# Patient Record
Sex: Female | Born: 1980 | Race: White | Hispanic: No | Marital: Married | Smoking: Never smoker
Health system: Southern US, Community
[De-identification: ages and names within clinical notes are randomized; demographics above are authoritative.]

## PROBLEM LIST (undated history)

## (undated) DIAGNOSIS — T8859XA Other complications of anesthesia, initial encounter: Secondary | ICD-10-CM

## (undated) DIAGNOSIS — T4145XA Adverse effect of unspecified anesthetic, initial encounter: Secondary | ICD-10-CM

## (undated) DIAGNOSIS — O24419 Gestational diabetes mellitus in pregnancy, unspecified control: Secondary | ICD-10-CM

## (undated) HISTORY — PX: WISDOM TOOTH EXTRACTION: SHX21

## (undated) HISTORY — DX: Gestational diabetes mellitus in pregnancy, unspecified control: O24.419

## (undated) HISTORY — PX: UTERINE FIBROID SURGERY: SHX826

---

## 2007-02-03 ENCOUNTER — Ambulatory Visit: Payer: Self-pay

## 2007-02-07 ENCOUNTER — Ambulatory Visit: Payer: Self-pay

## 2007-02-12 ENCOUNTER — Ambulatory Visit: Payer: Self-pay

## 2007-08-13 ENCOUNTER — Ambulatory Visit: Payer: Self-pay | Admitting: Unknown Physician Specialty

## 2007-08-14 ENCOUNTER — Ambulatory Visit: Payer: Self-pay | Admitting: Unknown Physician Specialty

## 2008-05-31 ENCOUNTER — Observation Stay: Payer: Self-pay

## 2008-07-08 ENCOUNTER — Observation Stay: Payer: Self-pay | Admitting: Obstetrics and Gynecology

## 2008-07-10 ENCOUNTER — Observation Stay: Payer: Self-pay

## 2008-07-17 ENCOUNTER — Inpatient Hospital Stay: Payer: Self-pay | Admitting: Obstetrics & Gynecology

## 2012-05-28 ENCOUNTER — Ambulatory Visit: Payer: Self-pay | Admitting: Obstetrics and Gynecology

## 2012-05-28 LAB — BASIC METABOLIC PANEL
BUN: 7 mg/dL (ref 7–18)
Calcium, Total: 8.5 mg/dL (ref 8.5–10.1)
Chloride: 111 mmol/L — ABNORMAL HIGH (ref 98–107)
Co2: 27 mmol/L (ref 21–32)
EGFR (Non-African Amer.): >60
Osmolality: 282 (ref 275–301)
Potassium: 4 mmol/L (ref 3.5–5.1)

## 2012-05-28 LAB — CBC
HGB: 12.2 g/dL (ref 12.0–16.0)
MCH: 29.9 pg (ref 26.0–34.0)
MCV: 89 fL (ref 80–100)
RBC: 4.07 10*6/uL (ref 3.80–5.20)
WBC: 6.8 10*3/uL (ref 3.6–11.0)

## 2012-05-28 LAB — PREGNANCY, URINE: Pregnancy Test, Urine: NEGATIVE m[IU]/mL

## 2012-06-08 ENCOUNTER — Inpatient Hospital Stay: Payer: Self-pay | Admitting: Obstetrics and Gynecology

## 2012-06-11 LAB — PATHOLOGY REPORT

## 2013-09-02 ENCOUNTER — Other Ambulatory Visit (HOSPITAL_COMMUNITY): Payer: Self-pay | Admitting: Obstetrics and Gynecology

## 2013-09-02 DIAGNOSIS — IMO0002 Reserved for concepts with insufficient information to code with codable children: Secondary | ICD-10-CM

## 2013-09-09 ENCOUNTER — Ambulatory Visit (HOSPITAL_COMMUNITY)
Admission: RE | Admit: 2013-09-09 | Discharge: 2013-09-09 | Disposition: A | Discharge status: Home / Self Care | Payer: BC Managed Care – PPO | Source: Ambulatory Visit | Attending: Obstetrics and Gynecology | Admitting: Obstetrics and Gynecology

## 2013-09-09 DIAGNOSIS — IMO0002 Reserved for concepts with insufficient information to code with codable children: Secondary | ICD-10-CM

## 2013-09-09 DIAGNOSIS — N979 Female infertility, unspecified: Secondary | ICD-10-CM | POA: Insufficient documentation

## 2013-09-09 MED ORDER — IOHEXOL 300 MG/ML  SOLN
20.0000 mL | Freq: Once | INTRAMUSCULAR | Status: AC | PRN
  Administered 2013-09-09: 20 mL

## 2013-12-25 LAB — OB RESULTS CONSOLE HIV ANTIBODY (ROUTINE TESTING): HIV: NONREACTIVE

## 2013-12-25 LAB — OB RESULTS CONSOLE ANTIBODY SCREEN: Antibody Screen: NEGATIVE

## 2013-12-25 LAB — OB RESULTS CONSOLE ABO/RH: RH Type: NEGATIVE

## 2013-12-25 LAB — OB RESULTS CONSOLE RPR: RPR: NONREACTIVE

## 2013-12-25 LAB — OB RESULTS CONSOLE RUBELLA ANTIBODY, IGM: Rubella: IMMUNE

## 2013-12-25 LAB — OB RESULTS CONSOLE HEPATITIS B SURFACE ANTIGEN: HEP B S AG: NEGATIVE

## 2014-05-14 ENCOUNTER — Encounter: Payer: BC Managed Care – PPO | Attending: Obstetrics and Gynecology

## 2014-05-14 VITALS — Ht 64.5 in | Wt 158.7 lb

## 2014-05-14 DIAGNOSIS — Z713 Dietary counseling and surveillance: Secondary | ICD-10-CM | POA: Insufficient documentation

## 2014-05-14 DIAGNOSIS — O24410 Gestational diabetes mellitus in pregnancy, diet controlled: Secondary | ICD-10-CM | POA: Diagnosis present

## 2014-05-15 NOTE — Progress Notes (Signed)
  Patient was seen on 05/15/14 for Gestational Diabetes self-management . The following learning objectives were met by the patient :   States the definition of Gestational Diabetes  States why dietary management is important in controlling blood glucose  Describes the effects of carbohydrates on blood glucose levels  Demonstrates ability to create a balanced meal plan  Demonstrates carbohydrate counting   States when to check blood glucose levels  Demonstrates proper blood glucose monitoring techniques  States the effect of stress and exercise on blood glucose levels  States the importance of limiting caffeine and abstaining from alcohol and smoking  Plan:  Aim for 2 Carb Choices per meal (30 grams) +/- 1 either way for breakfast Aim for 3 Carb Choices per meal (45 grams) +/- 1 either way from lunch and dinner Aim for 1-2 Carbs per snack Begin reading food labels for Total Carbohydrate and sugar grams of foods Consider  increasing your activity level by walking daily as tolerated Begin checking BG before breakfast and 2 hours after first bit of breakfast, lunch and dinner after  as directed by MD  Take medication  as directed by MD  Blood glucose monitor given: one Touch Ultra 2 Lot # [id number] Y Exp: 11/2014 Blood glucose reading: $RemoveBeforeDE'89mg'hrVeyGBVqVWNNet$ /dl  Patient instructed to monitor glucose levels: FBS: 60 - <90 2 hour: <120  Patient received the following handouts:  Nutrition Diabetes and Pregnancy  Carbohydrate Counting List  Meal Planning worksheet  Patient will be seen for follow-up as needed.

## 2014-06-09 ENCOUNTER — Other Ambulatory Visit: Payer: Self-pay | Admitting: Obstetrics and Gynecology

## 2014-07-07 ENCOUNTER — Encounter (HOSPITAL_COMMUNITY): Payer: Self-pay

## 2014-07-08 ENCOUNTER — Encounter (HOSPITAL_COMMUNITY): Payer: Self-pay

## 2014-07-08 ENCOUNTER — Encounter (HOSPITAL_COMMUNITY)
Admission: RE | Admit: 2014-07-08 | Discharge: 2014-07-08 | Disposition: A | Discharge status: Home / Self Care | Payer: BC Managed Care – PPO | Source: Ambulatory Visit | Attending: Obstetrics and Gynecology | Admitting: Obstetrics and Gynecology

## 2014-07-08 HISTORY — DX: Adverse effect of unspecified anesthetic, initial encounter: T41.45XA

## 2014-07-08 HISTORY — DX: Other complications of anesthesia, initial encounter: T88.59XA

## 2014-07-08 LAB — CBC
HCT: 38.1 % (ref 36.0–46.0)
Hemoglobin: 12.8 g/dL (ref 12.0–15.0)
MCH: 31 pg (ref 26.0–34.0)
MCHC: 33.6 g/dL (ref 30.0–36.0)
MCV: 92.3 fL (ref 78.0–100.0)
PLATELETS: 182 10*3/uL (ref 150–400)
RBC: 4.13 MIL/uL (ref 3.87–5.11)
RDW: 13.5 % (ref 11.5–15.5)
WBC: 10.8 10*3/uL — ABNORMAL HIGH (ref 4.0–10.5)

## 2014-07-08 LAB — BASIC METABOLIC PANEL
ANION GAP: 13 (ref 5–15)
BUN: 11 mg/dL (ref 6–23)
CHLORIDE: 102 meq/L (ref 96–112)
CO2: 23 meq/L (ref 19–32)
Calcium: 9 mg/dL (ref 8.4–10.5)
Creatinine, Ser: 0.49 mg/dL — ABNORMAL LOW (ref 0.50–1.10)
GFR calc Af Amer: >90 mL/min (ref >90)
GFR calc non Af Amer: >90 mL/min (ref >90)
Glucose, Bld: 91 mg/dL (ref 70–99)
Potassium: 4.1 mEq/L (ref 3.7–5.3)
SODIUM: 138 meq/L (ref 137–147)

## 2014-07-08 LAB — RPR

## 2014-07-08 NOTE — Patient Instructions (Addendum)
Your procedure is scheduled on:07/10/14  Enter through the Main Entrance at :0945 am Pick up desk phone and dial [phone] and inform us of your arrival.  Please call [phone] if you have any problems the morning of surgery.  Remember: Do not eat food or drink liquids, including water, after midnight:WED Clear liquids are ok until:7am on 07/10/14   You may brush your teeth the morning of surgery.   DO NOT wear jewelry, eye make-up, lipstick,body lotion, or dark fingernail polish.  (Polished toes are ok) You may wear deodorant.  If you are to be admitted after surgery, leave suitcase in car until your room has been assigned. Patients discharged on the day of surgery will not be allowed to drive home. Wear loose fitting, comfortable clothes for your ride home.

## 2014-07-09 LAB — TYPE AND SCREEN
ABO/RH(D): O NEG
Antibody Screen: POSITIVE
DAT, IGG: NEGATIVE
UNIT DIVISION: 0
Unit division: 0

## 2014-07-09 NOTE — H&P (Signed)
NAMCatalina [name]:  [name], [name]              ACCOUNT NO.:  [id number]  MEDICAL RECORD NO.:  [id number]  LOCATION:  PERIO                         FACILITY:  [facility]  PHYSICIAN:  Lenoard Adenichard J. Mersadie Kavanaugh, M.D.DATE OF BIRTH:  1981-01-19  DATE OF ADMISSION:  05/08/2014 DATE OF DISCHARGE:                             HISTORY & PHYSICAL   CHIEF COMPLAINT:  History of myomectomy with cavity entry for primary C- section at 37 weeks.  HISTORY OF PRESENT ILLNESS:  She is a 33 year old white female G3, P1, who presents for primary C-section at 37 weekS.  She has previous history of vaginal delivery. Previous Myomectomy.  Nonsmoker and nondrinker. Current pregnancy, complicated by gestational diabetes.  FAMILY HISTORY:  Crohn disease, heart disease, diabetes, hypertension, and lung cancer.  ALLERGIES:  Septra, Macrobid  PHYSICAL EXAMINATION:  GENERAL:  She is a well-developed, well- nourished, white female, in no acute distress. HEENT:  Normal. NECK:  Supple.  Full range of motion. LUNGS:  Clear. HEART:  Regular rate and rhythm. ABDOMEN:  Soft, gravid, nontender. EXt: no cords Neuro: non focal  IMPRESSION:  37 week IUP History of myomectomy with intracavitary entry. Gestational diabetes, diet controlled.  PLAN:  Proceed with primary low segment transverse cesarean section Risks of surgery to include delayed versus immediate complications including bowel and bladder injury with possible need for repair noted. Pt acknowledges  And desires to proceed. Consent done.     Lenoard Adenichard J. [name] [name], M.D.     RJT/MEDQ  D:  07/09/2014  T:  07/09/2014  Job:  [id number]

## 2014-07-10 ENCOUNTER — Inpatient Hospital Stay (HOSPITAL_COMMUNITY): Payer: BC Managed Care – PPO | Admitting: Anesthesiology

## 2014-07-10 ENCOUNTER — Inpatient Hospital Stay (HOSPITAL_COMMUNITY)
Admission: RE | Admit: 2014-07-10 | Discharge: 2014-07-12 | DRG: 766 | Disposition: A | Discharge status: Home / Self Care | Payer: BC Managed Care – PPO | Source: Ambulatory Visit | Attending: Obstetrics and Gynecology | Admitting: Obstetrics and Gynecology

## 2014-07-10 ENCOUNTER — Encounter (HOSPITAL_COMMUNITY): Payer: Self-pay | Admitting: *Deleted

## 2014-07-10 ENCOUNTER — Encounter (HOSPITAL_COMMUNITY)
Admission: RE | Disposition: A | Discharge status: Home / Self Care | Payer: Self-pay | Source: Ambulatory Visit | Attending: Obstetrics and Gynecology

## 2014-07-10 DIAGNOSIS — Z8249 Family history of ischemic heart disease and other diseases of the circulatory system: Secondary | ICD-10-CM

## 2014-07-10 DIAGNOSIS — O360930 Maternal care for other rhesus isoimmunization, third trimester, not applicable or unspecified: Secondary | ICD-10-CM | POA: Diagnosis present

## 2014-07-10 DIAGNOSIS — O24420 Gestational diabetes mellitus in childbirth, diet controlled: Secondary | ICD-10-CM | POA: Diagnosis present

## 2014-07-10 DIAGNOSIS — Z6791 Unspecified blood type, Rh negative: Secondary | ICD-10-CM

## 2014-07-10 DIAGNOSIS — O3429 Maternal care due to uterine scar from other previous surgery: Secondary | ICD-10-CM | POA: Diagnosis present

## 2014-07-10 DIAGNOSIS — O09523 Supervision of elderly multigravida, third trimester: Secondary | ICD-10-CM

## 2014-07-10 DIAGNOSIS — Z833 Family history of diabetes mellitus: Secondary | ICD-10-CM

## 2014-07-10 DIAGNOSIS — Z3A37 37 weeks gestation of pregnancy: Secondary | ICD-10-CM | POA: Diagnosis present

## 2014-07-10 DIAGNOSIS — O26899 Other specified pregnancy related conditions, unspecified trimester: Secondary | ICD-10-CM | POA: Diagnosis not present

## 2014-07-10 LAB — GLUCOSE, CAPILLARY: GLUCOSE-CAPILLARY: 83 mg/dL (ref 70–99)

## 2014-07-10 SURGERY — Surgical Case
Anesthesia: Spinal | Site: Abdomen

## 2014-07-10 MED ORDER — PHENYLEPHRINE 8 MG IN D5W 100 ML (0.08MG/ML) PREMIX OPTIME
INJECTION | INTRAVENOUS | Status: DC | PRN
  Administered 2014-07-10: 60 ug/min via INTRAVENOUS

## 2014-07-10 MED ORDER — SCOPOLAMINE 1 MG/3DAYS TD PT72
1.0000 | MEDICATED_PATCH | Freq: Once | TRANSDERMAL | Status: DC
  Administered 2014-07-10: 1.5 mg via TRANSDERMAL

## 2014-07-10 MED ORDER — ONDANSETRON HCL 4 MG/2ML IJ SOLN: 4.0000 mg | Freq: Three times a day (TID) | INTRAMUSCULAR | Status: DC | PRN

## 2014-07-10 MED ORDER — DIPHENHYDRAMINE HCL 50 MG/ML IJ SOLN: 12.5000 mg | INTRAMUSCULAR | Status: DC | PRN

## 2014-07-10 MED ORDER — BUPIVACAINE IN DEXTROSE 0.75-8.25 % IT SOLN
INTRATHECAL | Status: DC | PRN
  Administered 2014-07-10: 1.4 mL via INTRATHECAL

## 2014-07-10 MED ORDER — KETOROLAC TROMETHAMINE 30 MG/ML IJ SOLN
30.0000 mg | Freq: Four times a day (QID) | INTRAMUSCULAR | Status: AC | PRN
  Administered 2014-07-10: 30 mg via INTRAMUSCULAR

## 2014-07-10 MED ORDER — METHYLERGONOVINE MALEATE 0.2 MG/ML IJ SOLN: 0.2000 mg | INTRAMUSCULAR | Status: DC | PRN

## 2014-07-10 MED ORDER — SODIUM CHLORIDE 0.9 % IJ SOLN
INTRAMUSCULAR | Status: AC
  Filled 2014-07-10: qty 20

## 2014-07-10 MED ORDER — LACTATED RINGERS IV SOLN
Freq: Once | INTRAVENOUS | Status: AC
  Administered 2014-07-10: 10:00:00 via INTRAVENOUS

## 2014-07-10 MED ORDER — MENTHOL 3 MG MT LOZG: 1.0000 | LOZENGE | OROMUCOSAL | Status: DC | PRN

## 2014-07-10 MED ORDER — MEPERIDINE HCL 25 MG/ML IJ SOLN: 6.2500 mg | INTRAMUSCULAR | Status: DC | PRN

## 2014-07-10 MED ORDER — PRENATAL MULTIVITAMIN CH
1.0000 | ORAL_TABLET | Freq: Every day | ORAL | Status: DC
  Administered 2014-07-11 – 2014-07-12 (×2): 1 via ORAL
  Filled 2014-07-10 (×2): qty 1

## 2014-07-10 MED ORDER — OXYTOCIN 40 UNITS IN LACTATED RINGERS INFUSION - SIMPLE MED
INTRAVENOUS | Status: DC | PRN
  Administered 2014-07-10: 40 [IU] via INTRAVENOUS

## 2014-07-10 MED ORDER — DIPHENHYDRAMINE HCL 25 MG PO CAPS: 25.0000 mg | ORAL_CAPSULE | ORAL | Status: DC | PRN

## 2014-07-10 MED ORDER — DIPHENHYDRAMINE HCL 25 MG PO CAPS
25.0000 mg | ORAL_CAPSULE | Freq: Four times a day (QID) | ORAL | Status: DC | PRN
  Administered 2014-07-10: 25 mg via ORAL
  Filled 2014-07-10: qty 1

## 2014-07-10 MED ORDER — ONDANSETRON HCL 4 MG/2ML IJ SOLN
INTRAMUSCULAR | Status: AC
  Filled 2014-07-10: qty 2

## 2014-07-10 MED ORDER — FENTANYL CITRATE 0.05 MG/ML IJ SOLN
INTRAMUSCULAR | Status: DC | PRN
  Administered 2014-07-10: 15 ug via INTRATHECAL

## 2014-07-10 MED ORDER — FENTANYL CITRATE 0.05 MG/ML IJ SOLN
25.0000 ug | INTRAMUSCULAR | Status: DC | PRN
  Administered 2014-07-10: 25 ug via INTRAVENOUS
  Administered 2014-07-10: 50 ug via INTRAVENOUS

## 2014-07-10 MED ORDER — MORPHINE SULFATE (PF) 0.5 MG/ML IJ SOLN
INTRAMUSCULAR | Status: DC | PRN
  Administered 2014-07-10: .1 mg via INTRATHECAL

## 2014-07-10 MED ORDER — FENTANYL CITRATE 0.05 MG/ML IJ SOLN
INTRAMUSCULAR | Status: AC
  Administered 2014-07-10: 25 ug via INTRAVENOUS
  Filled 2014-07-10: qty 2

## 2014-07-10 MED ORDER — SCOPOLAMINE 1 MG/3DAYS TD PT72
MEDICATED_PATCH | TRANSDERMAL | Status: AC
  Administered 2014-07-10: 1.5 mg via TRANSDERMAL
  Filled 2014-07-10: qty 1

## 2014-07-10 MED ORDER — ONDANSETRON HCL 4 MG/2ML IJ SOLN: 4.0000 mg | INTRAMUSCULAR | Status: DC | PRN

## 2014-07-10 MED ORDER — PHENYLEPHRINE 8 MG IN D5W 100 ML (0.08MG/ML) PREMIX OPTIME
INJECTION | INTRAVENOUS | Status: AC
  Filled 2014-07-10: qty 100

## 2014-07-10 MED ORDER — ONDANSETRON HCL 4 MG/2ML IJ SOLN
INTRAMUSCULAR | Status: DC | PRN
  Administered 2014-07-10: 4 mg via INTRAVENOUS

## 2014-07-10 MED ORDER — OXYCODONE-ACETAMINOPHEN 5-325 MG PO TABS
2.0000 | ORAL_TABLET | ORAL | Status: DC | PRN
  Administered 2014-07-11: 2 via ORAL
  Filled 2014-07-10 (×2): qty 2

## 2014-07-10 MED ORDER — NALOXONE HCL 1 MG/ML IJ SOLN
1.0000 ug/kg/h | INTRAMUSCULAR | Status: DC | PRN
  Filled 2014-07-10: qty 2

## 2014-07-10 MED ORDER — NALBUPHINE HCL 10 MG/ML IJ SOLN: 5.0000 mg | Freq: Once | INTRAMUSCULAR | Status: AC | PRN

## 2014-07-10 MED ORDER — BUPIVACAINE HCL (PF) 0.25 % IJ SOLN
INTRAMUSCULAR | Status: DC | PRN
  Administered 2014-07-10: 10 mL

## 2014-07-10 MED ORDER — OXYTOCIN 10 UNIT/ML IJ SOLN
INTRAMUSCULAR | Status: AC
  Filled 2014-07-10: qty 4

## 2014-07-10 MED ORDER — MORPHINE SULFATE 0.5 MG/ML IJ SOLN
INTRAMUSCULAR | Status: AC
  Filled 2014-07-10: qty 10

## 2014-07-10 MED ORDER — SIMETHICONE 80 MG PO CHEW
80.0000 mg | CHEWABLE_TABLET | ORAL | Status: DC
  Administered 2014-07-11 – 2014-07-12 (×2): 80 mg via ORAL
  Filled 2014-07-10 (×2): qty 1

## 2014-07-10 MED ORDER — BUPIVACAINE HCL (PF) 0.25 % IJ SOLN
INTRAMUSCULAR | Status: AC
  Filled 2014-07-10: qty 30

## 2014-07-10 MED ORDER — SCOPOLAMINE 1 MG/3DAYS TD PT72
1.0000 | MEDICATED_PATCH | Freq: Once | TRANSDERMAL | Status: DC
  Filled 2014-07-10: qty 1

## 2014-07-10 MED ORDER — LACTATED RINGERS IV SOLN
INTRAVENOUS | Status: DC | PRN
  Administered 2014-07-10: 12:00:00 via INTRAVENOUS

## 2014-07-10 MED ORDER — PROMETHAZINE HCL 25 MG/ML IJ SOLN: 6.2500 mg | INTRAMUSCULAR | Status: DC | PRN

## 2014-07-10 MED ORDER — SENNOSIDES-DOCUSATE SODIUM 8.6-50 MG PO TABS
2.0000 | ORAL_TABLET | ORAL | Status: DC
  Administered 2014-07-11 (×2): 2 via ORAL
  Filled 2014-07-10 (×2): qty 2

## 2014-07-10 MED ORDER — NALBUPHINE HCL 10 MG/ML IJ SOLN: 5.0000 mg | INTRAMUSCULAR | Status: DC | PRN

## 2014-07-10 MED ORDER — LACTATED RINGERS IV SOLN
INTRAVENOUS | Status: DC
  Administered 2014-07-10: 22:00:00 via INTRAVENOUS

## 2014-07-10 MED ORDER — SODIUM CHLORIDE 0.9 % IJ SOLN
3.0000 mL | INTRAMUSCULAR | Status: DC | PRN
  Administered 2014-07-11: 3 mL via INTRAVENOUS
  Filled 2014-07-10: qty 3

## 2014-07-10 MED ORDER — KETOROLAC TROMETHAMINE 30 MG/ML IJ SOLN: 30.0000 mg | Freq: Four times a day (QID) | INTRAMUSCULAR | Status: AC | PRN

## 2014-07-10 MED ORDER — ONDANSETRON HCL 4 MG PO TABS: 4.0000 mg | ORAL_TABLET | ORAL | Status: DC | PRN

## 2014-07-10 MED ORDER — KETOROLAC TROMETHAMINE 30 MG/ML IJ SOLN
INTRAMUSCULAR | Status: AC
  Administered 2014-07-10: 30 mg via INTRAMUSCULAR
  Filled 2014-07-10: qty 1

## 2014-07-10 MED ORDER — WITCH HAZEL-GLYCERIN EX PADS: 1.0000 "application " | MEDICATED_PAD | CUTANEOUS | Status: DC | PRN

## 2014-07-10 MED ORDER — KETOROLAC TROMETHAMINE 30 MG/ML IJ SOLN: 15.0000 mg | Freq: Once | INTRAMUSCULAR | Status: DC | PRN

## 2014-07-10 MED ORDER — SIMETHICONE 80 MG PO CHEW: 80.0000 mg | CHEWABLE_TABLET | ORAL | Status: DC | PRN

## 2014-07-10 MED ORDER — LANOLIN HYDROUS EX OINT: 1.0000 "application " | TOPICAL_OINTMENT | CUTANEOUS | Status: DC | PRN

## 2014-07-10 MED ORDER — DIBUCAINE 1 % RE OINT: 1.0000 "application " | TOPICAL_OINTMENT | RECTAL | Status: DC | PRN

## 2014-07-10 MED ORDER — NALBUPHINE HCL 10 MG/ML IJ SOLN
5.0000 mg | INTRAMUSCULAR | Status: DC | PRN
  Administered 2014-07-11 (×3): 5 mg via INTRAVENOUS
  Filled 2014-07-10 (×3): qty 1

## 2014-07-10 MED ORDER — LACTATED RINGERS IV SOLN
INTRAVENOUS | Status: DC
  Administered 2014-07-10: 10:00:00 via INTRAVENOUS

## 2014-07-10 MED ORDER — OXYCODONE-ACETAMINOPHEN 5-325 MG PO TABS
1.0000 | ORAL_TABLET | ORAL | Status: DC | PRN
  Administered 2014-07-11 – 2014-07-12 (×5): 1 via ORAL
  Filled 2014-07-10 (×4): qty 1

## 2014-07-10 MED ORDER — NALOXONE HCL 0.4 MG/ML IJ SOLN: 0.4000 mg | INTRAMUSCULAR | Status: DC | PRN

## 2014-07-10 MED ORDER — OXYTOCIN 40 UNITS IN LACTATED RINGERS INFUSION - SIMPLE MED: 62.5000 mL/h | INTRAVENOUS | Status: AC

## 2014-07-10 MED ORDER — PHENYLEPHRINE HCL 10 MG/ML IJ SOLN
INTRAMUSCULAR | Status: DC | PRN
  Administered 2014-07-10: 40 ug via INTRAVENOUS
  Administered 2014-07-10: 80 ug via INTRAVENOUS
  Administered 2014-07-10: 40 ug via INTRAVENOUS

## 2014-07-10 MED ORDER — IBUPROFEN 600 MG PO TABS
600.0000 mg | ORAL_TABLET | Freq: Four times a day (QID) | ORAL | Status: DC
  Administered 2014-07-10 – 2014-07-12 (×8): 600 mg via ORAL
  Filled 2014-07-10 (×8): qty 1

## 2014-07-10 MED ORDER — CEFAZOLIN SODIUM-DEXTROSE 2-3 GM-% IV SOLR
2.0000 g | INTRAVENOUS | Status: AC
  Administered 2014-07-10: 2 g via INTRAVENOUS

## 2014-07-10 MED ORDER — SIMETHICONE 80 MG PO CHEW
80.0000 mg | CHEWABLE_TABLET | Freq: Three times a day (TID) | ORAL | Status: DC
  Administered 2014-07-10 – 2014-07-12 (×5): 80 mg via ORAL
  Filled 2014-07-10 (×6): qty 1

## 2014-07-10 MED ORDER — CEFAZOLIN SODIUM-DEXTROSE 2-3 GM-% IV SOLR
INTRAVENOUS | Status: AC
  Filled 2014-07-10: qty 50

## 2014-07-10 MED ORDER — FENTANYL CITRATE 0.05 MG/ML IJ SOLN
INTRAMUSCULAR | Status: AC
  Filled 2014-07-10: qty 2

## 2014-07-10 MED ORDER — ZOLPIDEM TARTRATE 5 MG PO TABS: 5.0000 mg | ORAL_TABLET | Freq: Every evening | ORAL | Status: DC | PRN

## 2014-07-10 MED ORDER — OXYTOCIN 40 UNITS IN LACTATED RINGERS INFUSION - SIMPLE MED
INTRAVENOUS | Status: DC | PRN
Start: 2014-07-10 — End: 2014-07-10

## 2014-07-10 MED ORDER — PHENYLEPHRINE 40 MCG/ML (10ML) SYRINGE FOR IV PUSH (FOR BLOOD PRESSURE SUPPORT)
PREFILLED_SYRINGE | INTRAVENOUS | Status: AC
  Filled 2014-07-10: qty 5

## 2014-07-10 MED ORDER — TETANUS-DIPHTH-ACELL PERTUSSIS 5-2.5-18.5 LF-MCG/0.5 IM SUSP: 0.5000 mL | Freq: Once | INTRAMUSCULAR | Status: DC

## 2014-07-10 MED ORDER — METHYLERGONOVINE MALEATE 0.2 MG PO TABS
0.2000 mg | ORAL_TABLET | ORAL | Status: DC | PRN
Start: 2014-07-10 — End: 2014-07-12

## 2014-07-10 SURGICAL SUPPLY — 42 items
BENZOIN TINCTURE PRP APPL 2/3 (GAUZE/BANDAGES/DRESSINGS) ×3 IMPLANT
CLAMP CORD UMBIL (MISCELLANEOUS) IMPLANT
CLOSURE WOUND 1/2 X4 (GAUZE/BANDAGES/DRESSINGS) ×1
CLOTH BEACON ORANGE TIMEOUT ST (SAFETY) ×3 IMPLANT
CONTAINER PREFILL 10% NBF 15ML (MISCELLANEOUS) IMPLANT
DERMABOND ADHESIVE PROPEN (GAUZE/BANDAGES/DRESSINGS) ×2
DERMABOND ADVANCED .7 DNX6 (GAUZE/BANDAGES/DRESSINGS) ×1 IMPLANT
DRAPE SHEET LG 3/4 BI-LAMINATE (DRAPES) IMPLANT
DRSG OPSITE POSTOP 4X10 (GAUZE/BANDAGES/DRESSINGS) ×3 IMPLANT
DURAPREP 26ML APPLICATOR (WOUND CARE) ×3 IMPLANT
ELECT REM PT RETURN 9FT ADLT (ELECTROSURGICAL) ×3
ELECTRODE REM PT RTRN 9FT ADLT (ELECTROSURGICAL) ×1 IMPLANT
EXTRACTOR VACUUM M CUP 4 TUBE (SUCTIONS) IMPLANT
EXTRACTOR VACUUM M CUP 4' TUBE (SUCTIONS)
GLOVE BIO SURGEON STRL SZ7.5 (GLOVE) ×3 IMPLANT
GOWN STRL REUS W/TWL LRG LVL3 (GOWN DISPOSABLE) ×6 IMPLANT
KIT ABG SYR 3ML LUER SLIP (SYRINGE) IMPLANT
NEEDLE HYPO 25X1 1.5 SAFETY (NEEDLE) ×3 IMPLANT
NEEDLE HYPO 25X5/8 SAFETYGLIDE (NEEDLE) IMPLANT
NEEDLE SPNL 20GX3.5 QUINCKE YW (NEEDLE) IMPLANT
NS IRRIG 1000ML POUR BTL (IV SOLUTION) ×3 IMPLANT
PACK C SECTION WH (CUSTOM PROCEDURE TRAY) ×3 IMPLANT
PAD ABD 8X7 1/2 STERILE (GAUZE/BANDAGES/DRESSINGS) ×3 IMPLANT
SPONGE GAUZE 4X4 12PLY STER LF (GAUZE/BANDAGES/DRESSINGS) ×3 IMPLANT
STAPLER VISISTAT 35W (STAPLE) IMPLANT
STRIP CLOSURE SKIN 1/2X4 (GAUZE/BANDAGES/DRESSINGS) ×2 IMPLANT
SUT MNCRL 0 VIOLET CTX 36 (SUTURE) ×2 IMPLANT
SUT MNCRL AB 3-0 PS2 27 (SUTURE) IMPLANT
SUT MON AB 2-0 CT1 27 (SUTURE) ×3 IMPLANT
SUT MON AB-0 CT1 36 (SUTURE) ×6 IMPLANT
SUT MONOCRYL 0 CTX 36 (SUTURE) ×4
SUT PLAIN 0 NONE (SUTURE) IMPLANT
SUT PLAIN 2 0 (SUTURE)
SUT PLAIN 2 0 XLH (SUTURE) ×3 IMPLANT
SUT PLAIN ABS 2-0 CT1 27XMFL (SUTURE) IMPLANT
SYR 20CC LL (SYRINGE) IMPLANT
SYR CONTROL 10ML LL (SYRINGE) ×3 IMPLANT
TAPE CLOTH 1X10 TAN NS (GAUZE/BANDAGES/DRESSINGS) ×3 IMPLANT
TAPE CLOTH SURG 4X10 WHT LF (GAUZE/BANDAGES/DRESSINGS) ×3 IMPLANT
TOWEL OR 17X24 6PK STRL BLUE (TOWEL DISPOSABLE) ×3 IMPLANT
TRAY FOLEY CATH 14FR (SET/KITS/TRAYS/PACK) ×3 IMPLANT
WATER STERILE IRR 1000ML POUR (IV SOLUTION) ×3 IMPLANT

## 2014-07-10 NOTE — Anesthesia Procedure Notes (Signed)
Spinal Patient location during procedure: OR Start time: 07/10/2014 11:30 AM Staffing Anesthesiologist: CASSIDY, AMY Performed by: anesthesiologist  Preanesthetic Checklist Completed: patient identified, site marked, surgical consent, pre-op evaluation, timeout performed, IV checked, risks and benefits discussed and monitors and equipment checked Spinal Block Patient position: sitting Prep: site prepped and draped and DuraPrep Patient monitoring: heart rate, cardiac monitor, continuous pulse ox and blood pressure Approach: midline Location: L3-4 Injection technique: single-shot Needle Needle type: Pencan  Needle gauge: 24 G Needle length: 9 cm Assessment Sensory level: T4 Additional Notes Clear free flow CSF on first pass.  No paresthesia.  Patient tolerated procedure well with no apparent complications.  Jasmine DecemberA. Cassidy, MD

## 2014-07-10 NOTE — Transfer of Care (Signed)
Immediate Anesthesia Transfer of Care Note  Patient: [name] HurlMonica S [name]  Procedure(s) Performed: Procedure(s) with comments: Primary CESAREAN SECTION (N/A) - EDD: 07/27/14  Patient Location: PACU  Anesthesia Type:Spinal  Level of Consciousness: awake, alert  and oriented  Airway & Oxygen Therapy: Patient Spontanous Breathing  Post-op Assessment: Report given to PACU RN and Post -op Vital signs reviewed and stable  Post vital signs: Reviewed and stable  Complications: No apparent anesthesia complications

## 2014-07-10 NOTE — Anesthesia Preprocedure Evaluation (Signed)
Anesthesia Evaluation  Patient identified by MRN, date of birth, ID band Patient awake    Reviewed: Allergy & Precautions, H&P , NPO status , Patient's Chart, lab work & pertinent test results, reviewed documented beta blocker date and time   History of Anesthesia Complications (+) history of anesthetic complications (required "more than expected" amount of medication for GA induction with myomectomy)  Airway Mallampati: I  TM Distance: >3 FB Neck ROM: full    Dental  (+) Teeth Intact   Pulmonary neg pulmonary ROS,  breath sounds clear to auscultation        Cardiovascular negative cardio ROS  Rhythm:regular Rate:Normal     Neuro/Psych negative neurological ROS  negative psych ROS   GI/Hepatic negative GI ROS, Neg liver ROS,   Endo/Other  diabetes (diet controlled), Gestational  Renal/GU negative Renal ROS     Musculoskeletal   Abdominal   Peds  Hematology negative hematology ROS (+)   Anesthesia Other Findings   Reproductive/Obstetrics (+) Pregnancy (h/o myomectomy --> primary C/S)                             Anesthesia Physical Anesthesia Plan  ASA: II  Anesthesia Plan: Spinal   Post-op Pain Management:    Induction:   Airway Management Planned:   Additional Equipment:   Intra-op Plan:   Post-operative Plan:   Informed Consent: I have reviewed the patients History and Physical, chart, labs and discussed the procedure including the risks, benefits and alternatives for the proposed anesthesia with the patient or authorized representative who has indicated his/her understanding and acceptance.     Plan Discussed with: Surgeon and CRNA  Anesthesia Plan Comments:         Anesthesia Quick Evaluation

## 2014-07-10 NOTE — Anesthesia Postprocedure Evaluation (Signed)
  Anesthesia Post-op Note  Anesthesia Post Note  Patient: [name] HurlMonica S [name]  Procedure(s) Performed: Procedure(s) (LRB): Primary CESAREAN SECTION (N/A)  Anesthesia type: Spinal  Patient location: PACU  Post pain: Pain level controlled  Post assessment: Post-op Vital signs reviewed  Last Vitals:  Filed Vitals:   07/10/14 1330  BP: 104/54  Pulse: 68  Temp:   Resp: 4    Post vital signs: Reviewed  Level of consciousness: awake  Complications: No apparent anesthesia complications

## 2014-07-10 NOTE — Lactation Note (Signed)
This note was copied from the chart of [name] Catalina PizzaMonica Libbey. Lactation Consultation Note  P2, Breastfed for 6 months until milk supply started dwindling.   Reviewed supply and demand. Mother states she knows how to hand express. Discussed chin tug and how to achieve a deeper latch. Mom encouraged to feed baby 8-12 times/24 hours and with feeding cues.  Mom made aware of O/P services, breastfeeding support groups, community resources, and our phone # for post-discharge questions.    Patient Name: [name] [name] FAOZH'YToday's Date: 07/10/2014 Reason for consult: Initial assessment   Maternal Data Has patient been taught Hand Expression?: Yes Does the patient have breastfeeding experience prior to this delivery?: Yes  Feeding Feeding Type: Breast Fed Length of feed: 10 min  LATCH Score/Interventions Latch: Repeated attempts needed to sustain latch, nipple held in mouth throughout feeding, stimulation needed to elicit sucking reflex. Intervention(s): Adjust position;Assist with latch  Audible Swallowing: A few with stimulation  Type of Nipple: Everted at rest and after stimulation  Comfort (Breast/Nipple): Soft / non-tender     Hold (Positioning): Assistance needed to correctly position infant at breast and maintain latch.  LATCH Score: 7  Lactation Tools Discussed/Used     Consult Status Consult Status: Follow-up Date: 07/11/14 Follow-up type: In-patient    Dahlia ByesBerkelhammer, Ruth MiLLCreek Community HospitalBoschen 07/10/2014, 10:34 PM

## 2014-07-10 NOTE — Op Note (Signed)
Cesarean Section Procedure Note  Indications: previous myomectomy  Pre-operative Diagnosis: 37 week 3 day pregnancy.  Post-operative Diagnosis: same  Surgeon: Lenoard AdenAAVON,Raffael Bugarin J   Assistants: Denyse AmassBhambri, CNM  Anesthesia: Local anesthesia 0.25.% bupivacaine and Spinal anesthesia  ASA Class: 2  Procedure Details  The patient was seen in the Holding Room. The risks, benefits, complications, treatment options, and expected outcomes were discussed with the patient.  The patient concurred with the proposed plan, giving informed consent. The risks of anesthesia, infection, bleeding and possible injury to other organs discussed. Injury to bowel, bladder, or ureter with possible need for repair discussed. Possible need for transfusion with secondary risks of hepatitis or HIV acquisition discussed. Post operative complications to include but not limited to DVT, PE and Pneumonia noted. The site of surgery properly noted/marked. The patient was taken to Operating Room # 9, identified as [name] [name] and the procedure verified as C-Section Delivery. A Time Out was held and the above information confirmed.  After induction of anesthesia, the patient was draped and prepped in the usual sterile manner. A Pfannenstiel incision was made and carried down through the subcutaneous tissue to the fascia. Fascial incision was made and extended transversely using Mayo scissors. The fascia was separated from the underlying rectus tissue superiorly and inferiorly. The peritoneum was identified and entered. Peritoneal incision was extended longitudinally. The utero-vesical peritoneal reflection was incised transversely and the bladder flap was bluntly freed from the lower uterine segment. A low transverse uterine incision(Kerr hysterotomy) was made. Delivered from OP presentation was a  Female.  with Apgar scores of 9 at one minute and 9 at five minutes. Bulb suctioning gently performed. Neonatal team in attendance.After the  umbilical cord was clamped and cut cord blood was obtained for evaluation. The placenta was removed intact and appeared normal. The uterus was curetted with a dry lap pack. Good hemostasis was noted.The uterine outline, tubes and ovaries appeared normal. The uterine incision was closed with running locked sutures of 0 Monocryl x 2 layers. Hemostasis was observed. Lavage was carried out until clear.The parietal peritoneum was closed with a running 2-0 Monocryl suture. The fascia was then reapproximated with running sutures of 0 Monocryl. The skin was reapproximated with 3-160monocryl after Caldwell closure with 2-0 plain.  Instrument, sponge, and needle counts were correct prior the abdominal closure and at the conclusion of the case.   Findings: FTLM, posterior placenta, nl tubes and ovaries  Estimated Blood Loss:  300 mL         Drains: foley                 Specimens: placenta                 Complications:  None; patient tolerated the procedure well.         Disposition: PACU - hemodynamically stable.         Condition: stable  Attending Attestation: I performed the procedure.

## 2014-07-10 NOTE — Consult Note (Signed)
Neonatology Note:   Attendance at C-section:    I was asked by Dr. Billy Coastaavon to attend this primary C/S at 37 weeks due to previous myomectomy. The mother is a G3P1A1 O neg, GBS neg with diet-controlled GDM. ROM at delivery, fluid clear. Infant vigorous with good spontaneous cry and tone. Needed only minimal bulb suctioning. Ap 9/9. Lungs clear to ausc in DR. To CN to care of Pediatrician.   [name] Souhristie C. Orchid Glassberg, MD

## 2014-07-10 NOTE — Plan of Care (Signed)
Problem: Phase I Progression Outcomes Goal: Foley catheter patent Outcome: Completed/Met Date Met:  07/10/14 Goal: IS, TCDB as ordered Outcome: Completed/Met Date Met:  07/10/14 Goal: Initial discharge plan identified Outcome: Completed/Met Date Met:  07/10/14

## 2014-07-10 NOTE — Progress Notes (Signed)
Patient ID: [name] HurlMonica S Kleier, female   DOB: 09/02/1980, 33 y.o.   MRN: [id number] Patient seen and examined. Consent witnessed and signed. No changes noted. Update completed.

## 2014-07-11 ENCOUNTER — Encounter (HOSPITAL_COMMUNITY): Payer: Self-pay | Admitting: *Deleted

## 2014-07-11 LAB — CBC
HCT: 33.1 % — ABNORMAL LOW (ref 36.0–46.0)
HEMOGLOBIN: 11.3 g/dL — AB (ref 12.0–15.0)
MCH: 31.3 pg (ref 26.0–34.0)
MCHC: 34.1 g/dL (ref 30.0–36.0)
MCV: 91.7 fL (ref 78.0–100.0)
Platelets: 167 10*3/uL (ref 150–400)
RBC: 3.61 MIL/uL — ABNORMAL LOW (ref 3.87–5.11)
RDW: 13.4 % (ref 11.5–15.5)
WBC: 11 10*3/uL — AB (ref 4.0–10.5)

## 2014-07-11 LAB — BIRTH TISSUE RECOVERY COLLECTION (PLACENTA DONATION)

## 2014-07-11 MED ORDER — RHO D IMMUNE GLOBULIN 1500 UNIT/2ML IJ SOSY
300.0000 ug | PREFILLED_SYRINGE | Freq: Once | INTRAMUSCULAR | Status: AC
  Administered 2014-07-11: 300 ug via INTRAVENOUS
  Filled 2014-07-11: qty 2

## 2014-07-11 NOTE — Lactation Note (Addendum)
This note was copied from the chart of [place]. Lactation Consultation Note  Patient Name: [name] [name] CYELY'H Date: 07/11/2014 Reason for consult: Follow-up assessment;Infant < 6lbs   Follow-up visit at 33 hrs age.  GA -37.4  Infant was circumcised this afternoon at 1700.  Infant has breastfed x7 (10-20 min) + attempts x5 (0-8 min) in past 24 hrs; voids -6 in 24 hrs; stools - 10 in 24 hrs; LS-9 by RN.  After 12 hrs of birth infant weight loss was 3%.  Infant was sleeping in crib upon entering room and not showing feeding cues.  Mom has tender nipple with some abrasions noted on tip of left nipple; mom has comfort gels in room and encouraged using EBM on nipples prior to applying CGels between feeds.  Based on 3% weight loss in first 12 hrs life, circumcision, early term status, and large amounts of stools LC expects weight loss to be greater tonight when infant is weighed.  Encouraged parents to hand express and give extra EBM back to infant after feedings or if he is too sleepy to feed then also give EBM.  Parents demonstrated understanding.  Spoons, colostrum collection container, and curved tip syringe given.  Reviewed hand expression with return demonstration by mom who easily hand expressed several drops colostrum in just a few tries (at least 1/2 ml).  Taught spoon feeding verbally (since infant was asleep).  Hand pump given for extra stimulation.  Mom reports low milk supply with first child but states she had latching difficulty; ended up pumping for 6-8 months with only about a maximum 2 oz production with each pumping with first child (now 107 yrs old).  Denies any thyroid issues.  Reports positive breast changes during pregnancy and leaking colostrum at end of pregnancy.  Basic education given regarding milk production and encouraged support group attendance; informed of outpatient support.  Mom has a DEBP at home from first child and requested pump kit; kit given as requested.   Encouraged to call for assistance as needed.     Maternal Data Has patient been taught Hand Expression?: Yes  Feeding Feeding Type: Breast Fed Length of feed: 8 min  LATCH Score/Interventions                      Lactation Tools Discussed/Used Pump Review: Setup, frequency, and cleaning;Milk Storage Initiated by:: [place] Date initiated:: 07/11/14   Consult Status Consult Status: Follow-up Date: 07/12/14 Follow-up type: In-patient    Merlene Laughter 07/11/2014, 8:24 PM

## 2014-07-11 NOTE — Plan of Care (Signed)
Problem: Phase I Progression Outcomes Goal: Pain controlled with appropriate interventions Outcome: Completed/Met Date Met:  07/11/14 Goal: OOB as tolerated unless otherwise ordered Outcome: Completed/Met Date Met:  07/11/14 Goal: VS, stable, temp < 100.4 degrees F Outcome: Completed/Met Date Met:  07/11/14

## 2014-07-11 NOTE — Anesthesia Postprocedure Evaluation (Signed)
Anesthesia Post Note  Patient: [name] [name]  Procedure(s) Performed: Procedure(s) (LRB): Primary CESAREAN SECTION (N/A)  Anesthesia type:Spinal  Patient location: Mother/Baby  Post pain: Pain level controlled  Post assessment: Post-op Vital signs reviewed  Last Vitals:  Filed Vitals:   07/11/14 0615  BP: 99/42  Pulse: 74  Temp: 36.5 C  Resp: 18    Post vital signs: Reviewed  Level of consciousness:alert  Complications: No apparent anesthesia complications

## 2014-07-11 NOTE — Addendum Note (Signed)
Addendum  created 07/11/14 0850 by Lincoln BrighamAngela Draughon Dontreal Miera, CRNA   Modules edited: Notes Section   Notes Section:  File: [id number]

## 2014-07-11 NOTE — Progress Notes (Signed)
Patient ID: [name] [name], female   DOB: 06/25/1981, 33 y.o.   MRN: [id number] Subjective: POD# 1 Information for the patient's newborn:  [name] [name], Boy Addy [[id number]][[id number]]  female  / circ planning  Reports feeling well Feeding: breast Patient reports tolerating PO.  Breast symptoms: none Pain controlled with ibuprofen (OTC) and narcotic analgesics including Percocet Denies HA/SOB/C/P/N/V/dizziness. Flatus absent. She reports vaginal bleeding as normal, without clots.  She is ambulating, urinating without difficult.     Objective:   VS:  Filed Vitals:   07/10/14 2200 07/11/14 0130 07/11/14 0615 07/11/14 1030  BP: 97/56 101/59 99/42 94/52   Pulse: 67 69 74 92  Temp: 97.7 F (36.5 C) 97 F (36.1 C) 97.7 F (36.5 C) 98 F (36.7 C)  TempSrc: Oral Oral Oral Oral  Resp: 18 18 18 16   Weight:      SpO2: 98% 98% 97% 98%     Intake/Output Summary (Last 24 hours) at 07/11/14 1134 Last data filed at 07/11/14 1019  Gross per 24 hour  Intake 8817.91 ml  Output   5050 ml  Net 3767.91 ml        Recent Labs  07/11/14 0530  WBC 11.0*  HGB 11.3*  HCT 33.1*  PLT 167     Blood type: O NEG (12/08 1015) / Rhophylac planned for later today prior to D/C of saline lock  Rubella: Immune (05/27 0000)     Physical Exam:  General: alert, cooperative and no distress CV: Regular rate and rhythm, S1S2 present or without murmur or extra heart sounds Resp: clear Abdomen: soft, nontender, normal bowel sounds Incision: Tegaderm adn Honeycomb dressing C/D/I - pressure dressing removed without difficulty - skin well-approximated with sutures Uterine Fundus: firm, 1 FB below umbilicus, nontender Lochia: minimal Ext: extremities normal, atraumatic, no cyanosis or edema and Homans sign is negative, no sign of DVT   Assessment/Plan: 33 y.o.   POD# 1.  s/p Cesarean Delivery.  Indications: Previous Myomectomy                Principal Problem:   Postpartum care following cesarean delivery  (12/10) Active Problems:   Cesarean delivery delivered  Doing well, stable.               Regular diet as tolerated Ambulate Routine post-op care Up to shower after 12 pm today  Kenard GowerAWSON, Dareld Mcauliffe, M, MSN, CNM 07/11/2014, 11:34 AM

## 2014-07-12 ENCOUNTER — Encounter (HOSPITAL_COMMUNITY): Payer: Self-pay | Admitting: Obstetrics and Gynecology

## 2014-07-12 ENCOUNTER — Ambulatory Visit: Payer: Self-pay

## 2014-07-12 DIAGNOSIS — O26899 Other specified pregnancy related conditions, unspecified trimester: Secondary | ICD-10-CM | POA: Diagnosis not present

## 2014-07-12 DIAGNOSIS — Z6791 Unspecified blood type, Rh negative: Secondary | ICD-10-CM

## 2014-07-12 LAB — RH IG WORKUP (INCLUDES ABO/RH)
ABO/RH(D): O NEG
FETAL SCREEN: NEGATIVE
GESTATIONAL AGE(WKS): 37.4
Unit division: 0

## 2014-07-12 MED ORDER — IBUPROFEN 600 MG PO TABS: 600.0000 mg | ORAL_TABLET | Freq: Four times a day (QID) | ORAL | Status: AC

## 2014-07-12 MED ORDER — OXYCODONE-ACETAMINOPHEN 5-325 MG PO TABS: 1.0000 | ORAL_TABLET | ORAL | Status: AC | PRN

## 2014-07-12 NOTE — Lactation Note (Signed)
This note was copied from the chart of [place]. Lactation Consultation Note  Patient Name: [name] KMKTL'Z Date: 07/12/2014 Reason for consult: Follow-up assessment;Infant < 6lbs   Follow-up for discharge.  Infant 61 hrs old at time of visit.  Infant has a 7% weight loss (can be explained through large amounts of stools), <6 lbs, and early term 37.4 infant.  Infant has breastfed x6 (10-20 min) + 6 attempts (5-8 min) + EBM x2 (2 ml) + formula x1 (23 ml) (Mom stated she has given formula twice after breastfeed but second formula not documented on flow sheet prior to discharge.  Mom states was about 15 ml).  Formula Preparation Education Sheet given to parents with Day of Life Guide on sheet reviewed.  Infant has had voids-4 in 24 hrs; 8 life; stools-6 in 24 hrs/ 14 life.  Reviewed supply & demand and encouraged exclusive breastfeeding with supplementation with EBM as needed if available.  Mom states she is beginning to feel breast changes (fullness) occurring today.  Mom had questions regarding pump kit; LC clarified type of pump mom has with picture identification of pump and explained pump kit parts for use with her Medela Pump in Style.  Encouraged support group attendance for infant weight checks and validate amount transferring since milk production is a concern of moms.  Reminded of outpatient support.  Encouraged to call for questions after discharge as needed.     Maternal Data    Feeding    LATCH Score/Interventions                      Lactation Tools Discussed/Used     Consult Status Consult Status: Complete    Merlene Laughter 07/12/2014, 1:54 PM

## 2014-07-12 NOTE — Progress Notes (Signed)
POD # 2  Subjective: Pt reports feeling well, desires early discharge/ Pain controlled with Motrin and Percocet Tolerating po/Voiding without problems/ No n/v/ Flatus present Activity: ad lib Bleeding is light Newborn info:  Information for the patient's newborn:  [name], Boy Jaine [[id number]][[id number]]  female  / Circumcision: done/ Feeding: breast and bottle   Objective: VS: VS:  Filed Vitals:   07/11/14 0615 07/11/14 1030 07/11/14 1743 07/12/14 0622  BP: 99/42 94/52 107/64 102/56  Pulse: 74 92 80 71  Temp: 97.7 F (36.5 C) 98 F (36.7 C) 98.3 F (36.8 C)   TempSrc: Oral Oral Oral   Resp: 18 16 18 18   Height:      Weight:      SpO2: 97% 98%  100%    I&O: Intake/Output      12/11 0701 - 12/12 0700 12/12 0701 - 12/13 0700   P.O.     I.V. (mL/kg) 10 (0.1)    Total Intake(mL/kg) 10 (0.1)    Urine (mL/kg/hr)     Blood     Total Output       Net +10            LABS:  Recent Labs  07/11/14 0530  WBC 11.0*  HGB 11.3*  PLT 167                           Physical Exam:  General: alert and cooperative CV: Regular rate and rhythm Resp: CTA bilaterally Abdomen: soft, nontender, normal bowel sounds Incision: healing well, no drainage, no erythema, no hernia, no seroma, no swelling, well approximated, honeycomb dsg c/d/i Uterine Fundus: firm, below umbilicus, nontender Lochia: minimal Ext: extremities normal, atraumatic, no cyanosis or edema and Homans sign is negative, no sign of DVT    Assessment: POD #2/ G3P2012/ S/P C/Section d/t myomectomy  Rh negative-Rhogam given Doing well and stable for discharge home  Plan: Discharge home pending newborn discharge RX's: Ibuprofen 600mg  po Q 6 hrs prn pain #30 Refill x 1 Percocet 5/325 1 - 2 tabs po every 4 hrs prn pain #30 Refill x 0 [facility] booklet given    Signed: Lawernce PittsBHAMBRI, Danyel Tobey, N, MSN, CNM 07/12/2014, 10:02 AM

## 2014-07-13 NOTE — Discharge Summary (Signed)
POSTOPERATIVE DISCHARGE SUMMARY:  Patient ID: [name] [name] MRN: [id number] DOB/AGE: 35/10/1980 35 y.o.  Admit date: 07/10/2014 Admission Diagnoses: 37.[redacted] weeks gestation, previous myomectomy   Discharge date: 07/13/2014 Discharge Diagnoses: S/P C/S on 07/10/14        Prenatal history: [id number]   EDC: 07/27/2014, by Other Basis  [facility] since [redacted] wks gestation. Primary provider: Dr. Billy Coastaavon Prenatal course complicated by previous myomectomy, AMA, A1GDM  Prenatal labs: ABO, Rh: --/--/O NEG (12/11 0530) / Rhophylac 05/05/14 Antibody: POS (12/08 1015) Rubella:   Immune RPR: NON REAC (12/08 1015)  HBsAg: Negative (05/27 0000)  HIV: Non-reactive (05/27 0000)  GBS:   Neg GTT: 145, failed 3 hr  Medical / Surgical History :  Past medical history:  Past Medical History  Diagnosis Date  . Gestational diabetes mellitus, antepartum   . Complication of anesthesia     epidural only worked on one side of body  . Complication of anesthesia     needed extra drugs for GA for myomectomy    Past surgical history:  Past Surgical History  Procedure Laterality Date  . Uterine fibroid surgery    . Wisdom tooth extraction    . Cesarean section N/A 07/10/2014    Procedure: Primary CESAREAN SECTION;  Surgeon: Lenoard Adenichard J Taavon, MD;  Location: [facility];  Service: Obstetrics;  Laterality: N/A;  EDD: 07/27/14     Medications on Admission: No prescriptions prior to admission    Allergies: Mineral oil and Septra   Intrapartum Course:  Admitted for scheduled primary CS under regional anesthesia   Postpartum Course: Uncomplicated   Physical Exam:   VSS: Blood pressure 102/56, pulse 71, temperature 98.3 F (36.8 C), temperature source Oral, resp. rate 18, height 5\' 4"  (1.626 m), weight 78.019 kg (172 lb), last menstrual period 09/01/2013, SpO2 100 %, unknown if currently breastfeeding.  LABS:  Recent Labs  07/11/14 0530  WBC 11.0*  HGB 11.3*   PLT 167    General: Alert and oriented x3 Heart: RRR Lungs: CTA bilaterally GI: soft, non-tender, non-distended, BS x4 Lochia: small Uterus: firm below umbilicus Incision: well approximated with suture; honeycomb dressing-no significant erythema, drainage, or edema Extremities: No edema, Homans neg   Newborn Data Live born female  Birth Weight: 6 lb 1.2 oz (2755 g) APGAR: 9, 9  See operative report for further details  Home with mother.  Discharge Instructions:  Wound Care: keep clean and dry / remove honeycomb POD 6 Postpartum Instructions: [facility] discharge booklet - instructions reviewed Medications:    Medication List    TAKE these medications        fluticasone 50 MCG/ACT nasal spray  Commonly known as:  FLONASE  Place 1 spray into both nostrils every other day.     ibuprofen 600 MG tablet  Commonly known as:  ADVIL,MOTRIN  Take 1 tablet (600 mg total) by mouth every 6 (six) hours.     oxyCODONE-acetaminophen 5-325 MG per tablet  Commonly known as:  PERCOCET/ROXICET  Take 1-2 tablets by mouth every 4 (four) hours as needed (for pain scale equal to or greater than 7).     PRENATAL VITAMINS PO  Take 1 each by mouth daily.            Follow-up Information    Follow up with Lenoard AdenAAVON,RICHARD J, MD. Schedule an appointment as soon as possible for a visit in 6 weeks.   Specialty:  Obstetrics and Gynecology   Contact information:  [address] [place] [id number] [phone]         Signed: Donette LarryBHAMBRI, Chevelle Coulson, Dorris CarnesN MSN, CNM 07/13/2014, 12:02 PM

## 2014-08-04 IMAGING — RF DG HYSTEROGRAM
5 series · 5 of 5 positions shown · IV contrast (omnipaque)
Comparison: None.

FLUOROSCOPY TIME:  42 seconds

CLINICAL DATA: Secondary infertility.

EXAM:
HYSTEROSALPINGOGRAM
TECHNIQUE: Following cleansing of the cervix and vagina with Betadine solution,
a hysterosalpingogram was performed using a 5-French
hysterosalpingogram catheter and Omnipaque 300 contrast. The patient
tolerated the examination without difficulty.

[Series 1: run · 1 of 1 slices shown (1 of 5)]
[im 1/1]
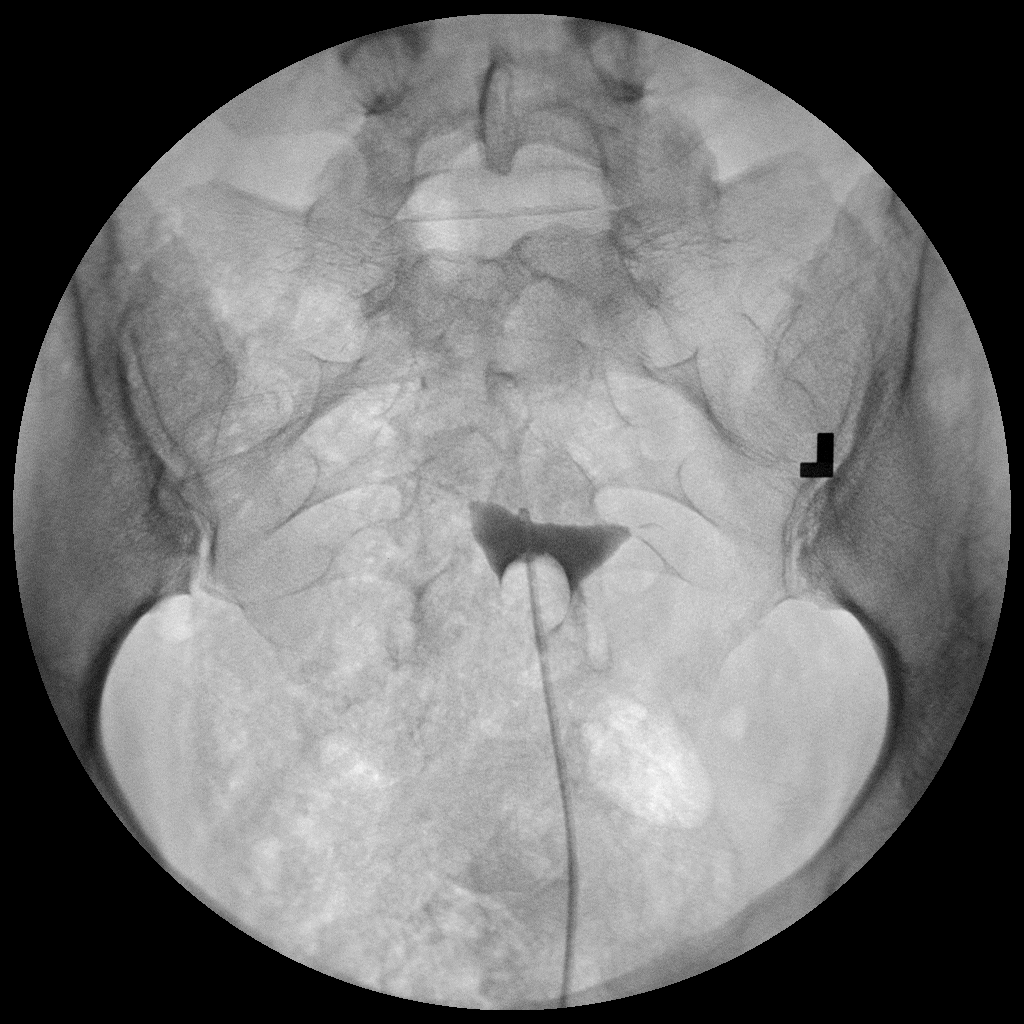

[Series 2: run · 1 of 1 slices shown (2 of 5)]
[im 1/1]
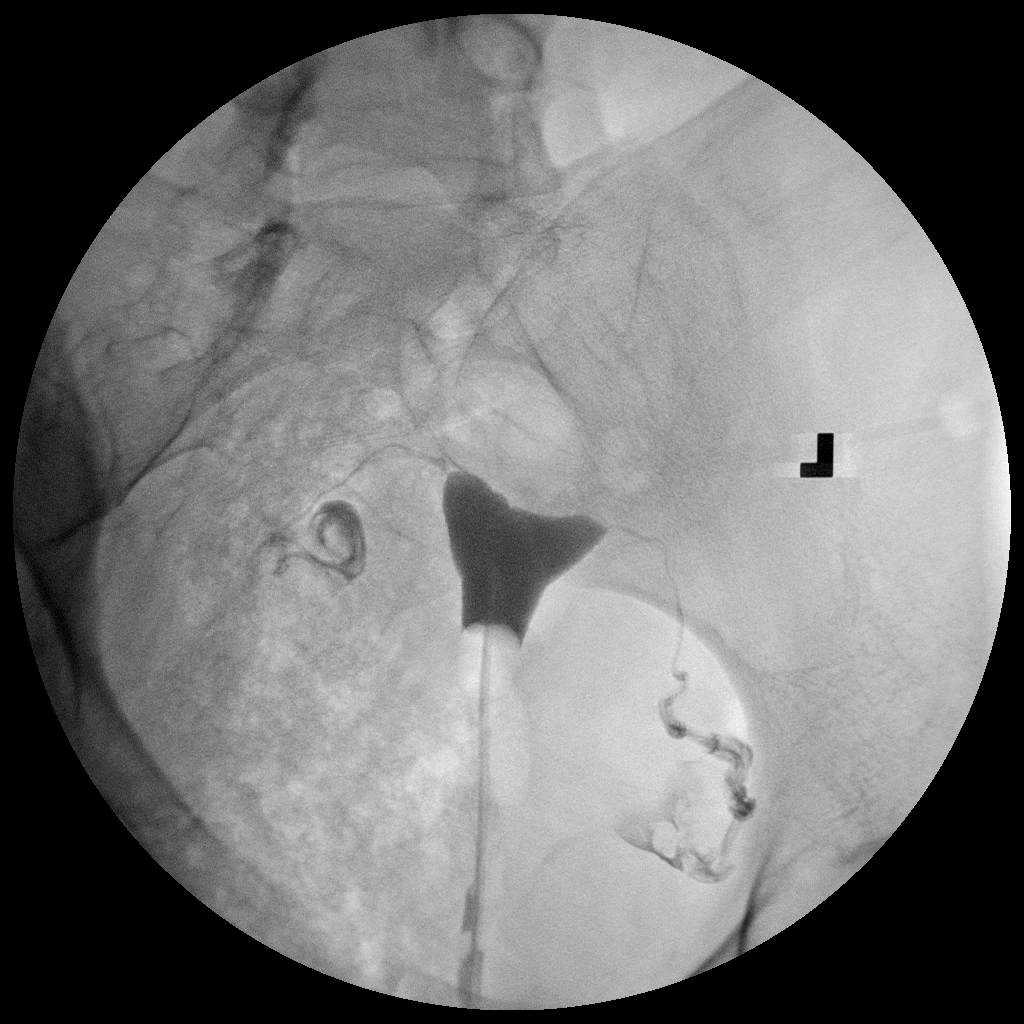

[Series 3: run · 1 of 1 slices shown (3 of 5)]
[im 1/1]
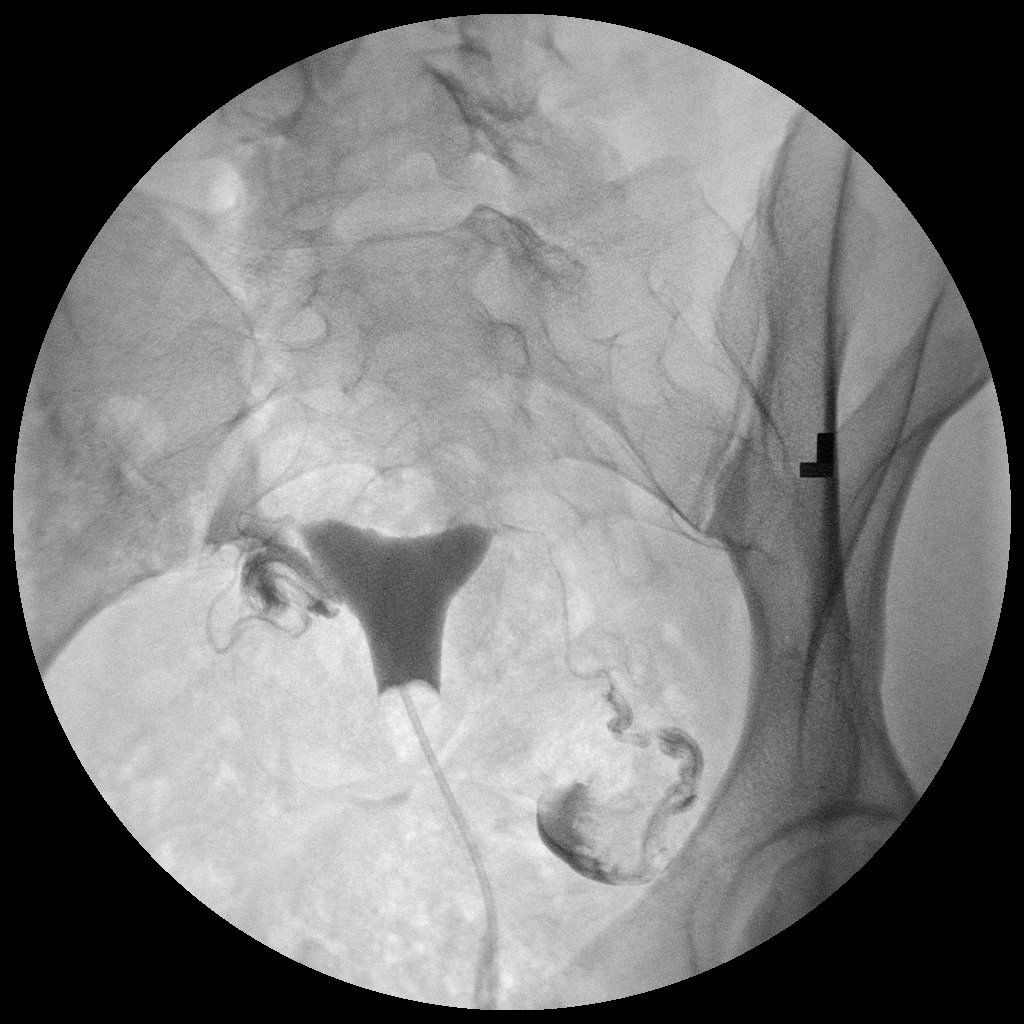

[Series 4: run · 1 of 1 slices shown (4 of 5)]
[im 1/1]
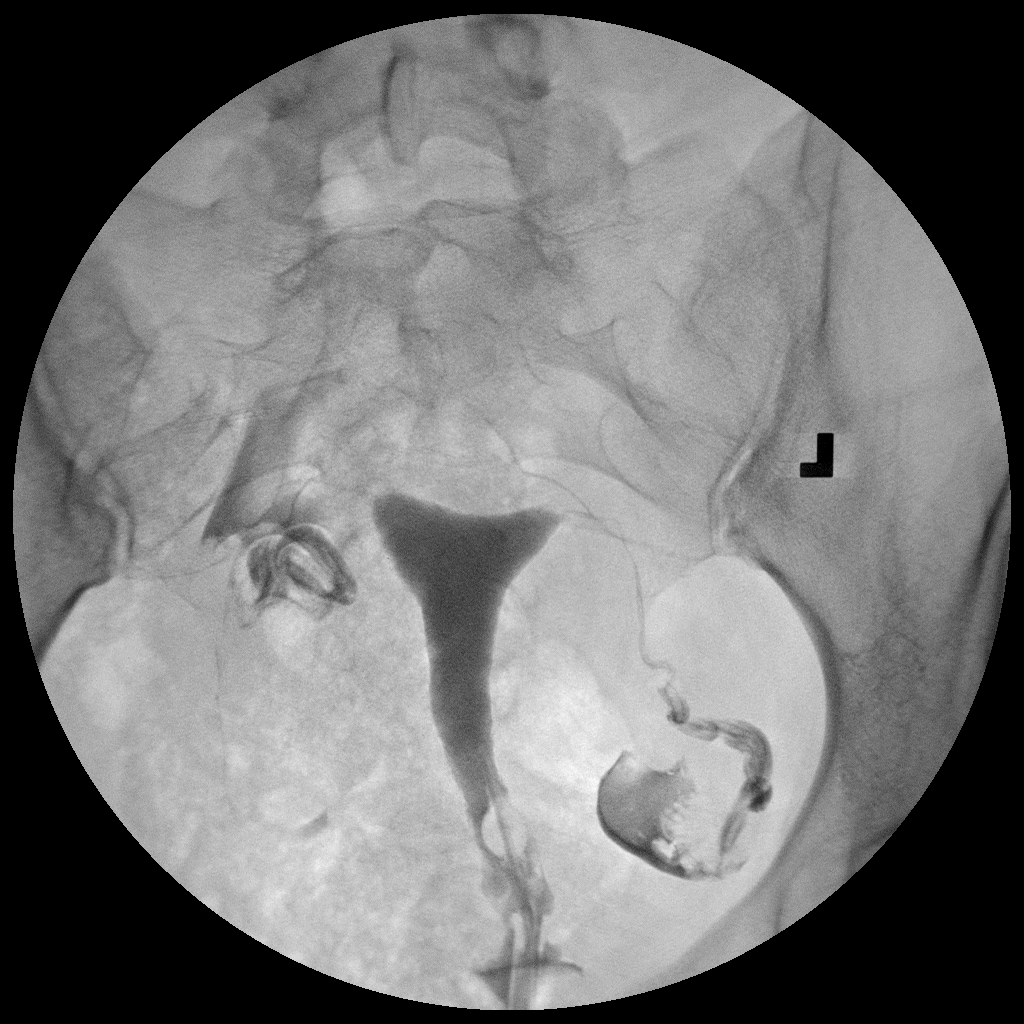

[Series 5: run · 1 of 1 slices shown (5 of 5)]
[im 1/1]
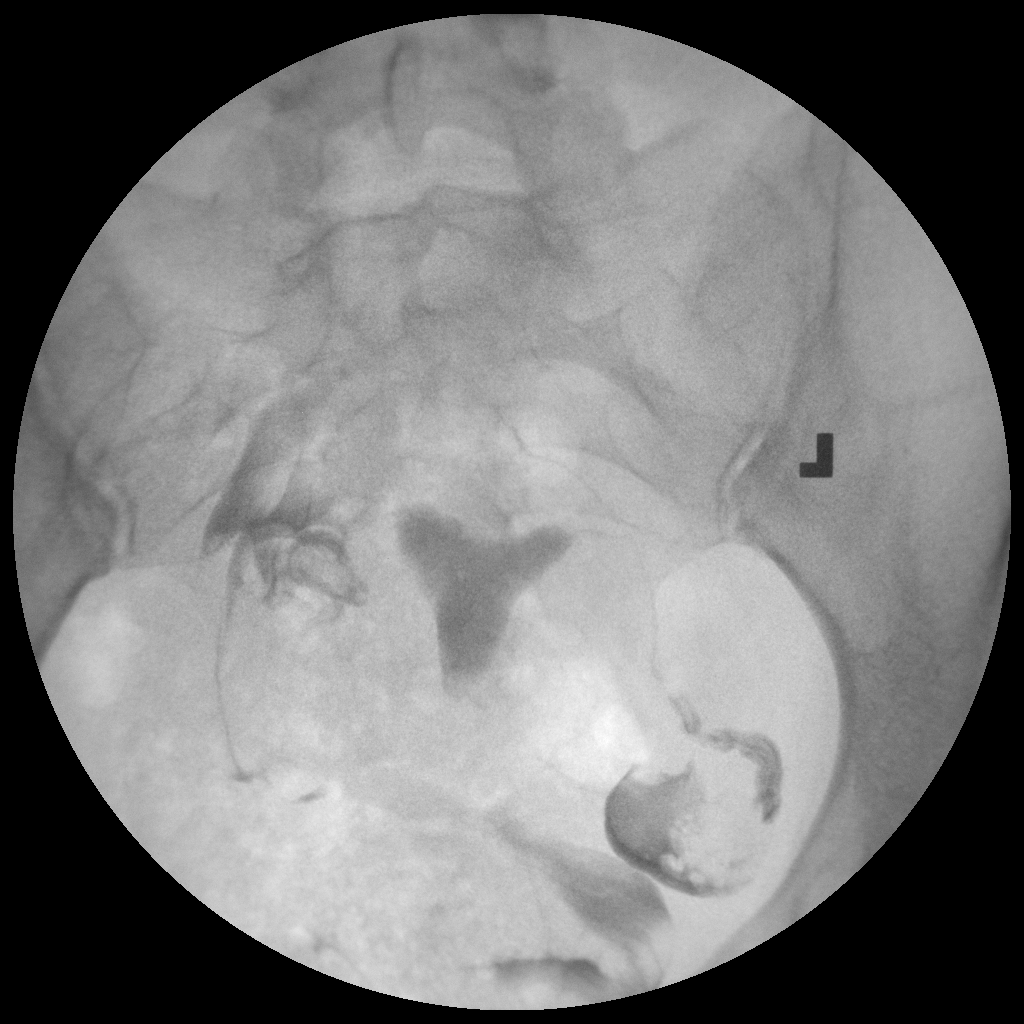

[5 of 5 positions shown; findings below may reference images not displayed]

FINDINGS: The uterine contour is normal. There is opacification of both
fallopian tubes which have normal contour. There is bilateral
peritoneal spill.
IMPRESSION: Normal HSG.

## 2014-11-18 NOTE — Op Note (Signed)
PATIENT NAME:  [name] [name], [name] [name] MR#:  [id number] DATE OF BIRTH:  07-09-1981  DATE OF PROCEDURE:  06/08/2012  PREOPERATIVE DIAGNOSIS:  Large posterior uterine fibroid with desire to maintain uterus.   POSTOPERATIVE DIAGNOSIS:  Large posterior uterine fibroid with desire to maintain uterus.   PROCEDURE: Open myomectomy.   ESTIMATED BLOOD LOSS: 150 mL.   SURGEON: Elliot Gurneyarrie C Shakai Dolley, M.D.   ASSISTANT: Kate SablePhilip Rosenow, M.D.   FINDINGS:  Uterine and cervix-shaped fibroid on the posterior aspect of the uterus and the fibroid with some communication with the endometrium and the fibroid which was removed and repaired.   DESCRIPTION OF PROCEDURE: The patient was taken to the operating room and placed in the supine position. After adequate general endotracheal anesthesia was instilled, the patient was prepped and draped in the usual sterile fashion. A Pfannenstiel skin incision was made approximately three fingerbreadths above the pubic symphysis and carried sharply down to the fascia. The fascia was nicked in the midline. The incision was extended in a superolateral manner. Muscle bellies were identified. The fascia was sharply and bluntly dissected off the rectus fascia. The midline of the muscles was identified. The peritoneum was grasped, the muscles were opened. The patient was laid in Trendelenburg. The bowels were packed back with moist laparotomy sponges. The uterus was delivered to the exterior of the abdomen. The fibroid was identified. It was found to be very boggy. Uterine incision was made on the posterior aspect of the uterus and the fibroid was sharply and bluntly dissected out of the muscle.  This was very difficult as the muscle and the fibroid were of the same consistency. The pseudocapsule was identified and the fibroid was removed. It was found to be the same shape as the uterus, but using the uterine anatomy tubes were found to be intact. The cavity was found to be intact. A small defect was seen  where the fibroid had grown into the inside of the uterus. The fibroid was cut off at this area in order to save the uterine lining. The posterior aspect of the uterine lining and the endometrium were put together with serial interrupted sutures of 4-0 chromic suture. The cavity was kept intact.  Deep sutures were then used to approximate the deep spaces. Serial interrupted sutures of Vicryl were used to approximate the muscles. The muscles were closed in layers over the endometrium. The serosal tissue was found to be loose from the fibroid removal and it was pulled  over the backside of the uterus with a T-shaped incision used to approximate the edges.  Bowel was irrigated. Laps were removed. The uterus was placed into the pelvis. Good hemostasis was identified. The muscle bellies were approximated. On-Q pain pump was placed. Fascia was closed. Skin was closed with a running 4-0 Monocryl. 4-0 Monocryl suture. Bandages were placed and the patient was taken to recovery after having tolerated the procedure well.     ____________________________ Elliot Gurneyarrie C. Drue Harr, MD cck:[id number] D: 06/14/2012 08:43:00 ET T: 06/14/2012 10:47:20 ET JOB#: [id number]  cc: Elliot Gurneyarrie C. Chaniah Cisse, MD, <Dictator> Elliot GurneyARRIE C Claudy Abdallah MD ELECTRONICALLY SIGNED 06/15/2012 13:10
